# Patient Record
Sex: Female | Born: 1970 | Race: White | Hispanic: No | Marital: Married | Smoking: Never smoker
Health system: Southern US, Community
[De-identification: ages and names within clinical notes are randomized; demographics above are authoritative.]

## PROBLEM LIST (undated history)

## (undated) HISTORY — PX: DILATION AND CURETTAGE OF UTERUS: SHX78

## (undated) HISTORY — PX: TUBAL LIGATION: SHX77

---

## 2001-08-13 ENCOUNTER — Other Ambulatory Visit: Admission: RE | Admit: 2001-08-13 | Discharge: 2001-08-13 | Payer: Self-pay | Admitting: Obstetrics and Gynecology

## 2003-02-10 ENCOUNTER — Ambulatory Visit (HOSPITAL_COMMUNITY): Admission: RE | Admit: 2003-02-10 | Discharge: 2003-02-10 | Payer: Self-pay | Admitting: Obstetrics and Gynecology

## 2003-02-10 ENCOUNTER — Encounter (INDEPENDENT_AMBULATORY_CARE_PROVIDER_SITE_OTHER): Payer: Self-pay

## 2011-02-17 NOTE — H&P (Signed)
Kylie, [name]                         ACCOUNT NO.:  [id number]   MEDICAL RECORD NO.:  [id number]                   PATIENT TYPE:  AMB   LOCATION:  [facility]C                                  FACILITY:  [facility]   PHYSICIAN:  Dineen Kid. Rana Snare, M.D.                 DATE OF BIRTH:  [birth year]   DATE OF ADMISSION:  02/10/2003  DATE OF DISCHARGE:                                HISTORY & PHYSICAL   HISTORY OF PRESENT ILLNESS:  The patient is a 40 year old gravida 3, para 0,  abortus 3, that presents with menometrorrhagia and pelvic cramping. She has  a history of anovulation. She presented to the office on January 27, 2003,  having had heavy bleeding and not having had a cycle for several months  before this. She was placed on Provera.  She subsequently followed up with  more cramping, had sonohysterogram performed which showed a retroverted  uterus with several small uterine fibroids, one palpated in the lower  segment of the uterus.  On the sonohysterogram, the other submucosal on the  fundal area.  The ovaries appeared to be polycystic in nature on the  sonohysterogram, but several irregularities along the endometrial lining  consistent with polyps were also noted within the endometrial cavity as well  as fibroid.  The patient had been placed on Ov-Con 35 which dramatically  slowed the bleeding, however, over the last 24 to 48 hours, the patient  began having more cramping, albeit, not increasing bleeding and her  hemoglobin has been 13.7.  She presents for hysteroscopy and D&C for  evaluation and treatment of the endometrial lining.   PAST MEDICAL HISTORY:  Significant for infertility and polycystic ovarian  syndrome.  She has been through a fertility evaluation with normal  evaluation of the Prolactin and thyroid. She has also been placed on  Glucophage for ovulation stimulation.  At this point she is not trying to  get pregnant.   PAST GYN HISTORY:  She has had normal Pap smears.   PAST  OBSTETRICAL HISTORY:  She has had two early miscarriages, one at four  months and one at 2-1/2 months, and the third one she is not sure how far  along she was.   MEDICATIONS:  1. Ov-Con 35.  2. Ibuprofen.  3. Vicodin.   ALLERGIES:  No known drug allergies.   PHYSICAL EXAMINATION:  VITAL SIGNS:  Weight 284, blood pressure 124/80.  HEART:  Regular rate and rhythm.  LUNGS:  Clear to auscultation bilaterally.  ABDOMEN:  Obese, nontender.  PELVIC:  The uterus is anteverted, mobile, and nontender.  No adnexal masses  are palpable.   IMPRESSION:  Menometrorrhagia and dysmenorrhea.  Ultrasound consistent with  fibroids and endometrial polyps.  Recommend hysteroscopy and D&C for  evaluation and treatment of the polyps.  The patient understands that this  may or may not alleviate the cramping and may or may not  alleviate the  uterine bleeding. Because of the irregular surface and questionable polyps,  I recommend removal of these and evaluation by pathologist. If the fibroids  are significant, then we would not resect at the time, but otherwise would  consider myomectomy or even hysterectomy in the future.  The patient wishes  to proceed with plan to remove polyps, but not fibroids at this time.  Risks  and benefits were discussed at length which include, but are not limited to  risk of infection, bleeding, damage to uterus, tubes, ovaries, bowel, and  bladder, again the possibility that this may not alleviate the pain or the  bleeding and may require further surgery in the future, and risks associated  with blood transfusion. She does give her informed consent.                                               Dineen Kid Rana Snare, M.D.    DCL/MEDQ  D:  02/10/2003  T:  02/10/2003  Job:  [id number]

## 2011-02-17 NOTE — Op Note (Signed)
[name], [name]                         ACCOUNT NO.:  [id number]   MEDICAL RECORD NO.:  [id number]                   PATIENT TYPE:  AMB   LOCATION:  [facility]C                                  FACILITY:  [facility]   PHYSICIAN:  Dineen Kid. Rana Snare, M.D.                 DATE OF BIRTH:  1970/10/27   DATE OF PROCEDURE:  02/10/2003  DATE OF DISCHARGE:                                 OPERATIVE REPORT   PREOPERATIVE DIAGNOSES:  1. Menometrorrhagia.  2. Dysmenorrhea.   POSTOPERATIVE DIAGNOSES:  1. Menorrhagia.  2. Dysmenorrhea.   PROCEDURES:  Hysteroscopy, dilatation and curettage.   SURGEON:  Dineen Kid. Rana Snare, M.D.   ANESTHESIA:  General by mask.   INDICATIONS:  The patient is a 40 year old G3, P0, with worsening  menometrorrhagia.  Sonohysterogram shows thickening and irregular  endometrium with intramural fibroid and questionable polyps.  She has  continued to have heavy and irregular bleeding and presents for definitive  surgical evaluation and treatment, planned hysteroscopy D&C.  The risks and  benefits were discussed at length.  Informed consent was obtained.  See  history and physical for further details.   FINDINGS:  Findings at the time of surgery were thickened and irregular  endometrial surface with polypoid-appearing tissue.  After the D&C, a normal-  appearing cervix and endometrium were noted and normal-appearing ostia.   DESCRIPTION OF PROCEDURE:  After adequate analgesia, the patient was placed  in the dorsal lithotomy position.  She was sterilely prepped and draped, the  bladder was sterilely drained.  A weighted speculum was placed.  A tenaculum  was placed on the anterior lip of the cervix.  The uterus is sounded to 10  cm, easily dilated serially to a #29 Pratt dilator.  The hysteroscope was  inserted and the above findings were noted.  Curettage was performed,  retrieving a mild to moderate amount of endometrial curettings.  This was  performed until a gritty surface was  felt throughout the endometrial cavity,  followed by the hysteroscope revealing a normal-appearing endometrial cavity  at this point with no residual polypoid tissue, normal-appearing ostia,  normal-appearing cervix.  At this point the hysteroscope was removed, the  tenaculum was removed from the cervix, noted to be hemostatic.  The speculum  was then removed.  The patient was transferred to the recovery room in  stable condition.  Sponge and instrument count were normal x2.  Estimated  blood loss was less than 20 mL, and the sorbitol deficit was 50 mL.    DISPOSITION:  The patient is discharged home, to follow up in the office in  two to three weeks, with a routine instruction sheet for D&C.  She is going  to continue with her Ovcon 35 one a day, and she was given a prescription  for Tylox one to two every four hours as needed for pain.  Told to return  for increased  pain, fever, or bleeding.                                               Dineen Kid Rana Snare, M.D.    DCL/MEDQ  D:  02/10/2003  T:  02/11/2003  Job:  [id number]

## 2018-01-05 ENCOUNTER — Emergency Department (HOSPITAL_BASED_OUTPATIENT_CLINIC_OR_DEPARTMENT_OTHER): Payer: Self-pay

## 2018-01-05 ENCOUNTER — Encounter (HOSPITAL_BASED_OUTPATIENT_CLINIC_OR_DEPARTMENT_OTHER): Payer: Self-pay | Admitting: *Deleted

## 2018-01-05 ENCOUNTER — Other Ambulatory Visit: Payer: Self-pay

## 2018-01-05 ENCOUNTER — Emergency Department (HOSPITAL_BASED_OUTPATIENT_CLINIC_OR_DEPARTMENT_OTHER)
Admission: EM | Admit: 2018-01-05 | Discharge: 2018-01-06 | Disposition: A | Discharge status: Home / Self Care | Payer: Self-pay | Attending: Emergency Medicine | Admitting: Emergency Medicine

## 2018-01-05 DIAGNOSIS — J189 Pneumonia, unspecified organism: Secondary | ICD-10-CM

## 2018-01-05 DIAGNOSIS — R1032 Left lower quadrant pain: Secondary | ICD-10-CM | POA: Insufficient documentation

## 2018-01-05 DIAGNOSIS — Z79899 Other long term (current) drug therapy: Secondary | ICD-10-CM | POA: Insufficient documentation

## 2018-01-05 DIAGNOSIS — R109 Unspecified abdominal pain: Secondary | ICD-10-CM

## 2018-01-05 DIAGNOSIS — J181 Lobar pneumonia, unspecified organism: Secondary | ICD-10-CM

## 2018-01-05 LAB — URINALYSIS, ROUTINE W REFLEX MICROSCOPIC
BILIRUBIN URINE: NEGATIVE
Glucose, UA: NEGATIVE mg/dL
KETONES UR: NEGATIVE mg/dL
Leukocytes, UA: NEGATIVE
NITRITE: NEGATIVE
Protein, ur: NEGATIVE mg/dL
Specific Gravity, Urine: 1.01 (ref 1.005–1.030)
pH: 7 (ref 5.0–8.0)

## 2018-01-05 LAB — PREGNANCY, URINE: PREG TEST UR: NEGATIVE

## 2018-01-05 LAB — URINALYSIS, MICROSCOPIC (REFLEX)

## 2018-01-05 MED ORDER — SODIUM CHLORIDE 0.9 % IV BOLUS
1000.0000 mL | Freq: Once | INTRAVENOUS | Status: AC
  Administered 2018-01-05: 1000 mL via INTRAVENOUS

## 2018-01-05 MED ORDER — HYDROMORPHONE HCL 1 MG/ML IJ SOLN
INTRAMUSCULAR | Status: AC
  Administered 2018-01-05: 1 mg
  Filled 2018-01-05: qty 1

## 2018-01-05 MED ORDER — PROMETHAZINE HCL 25 MG/ML IJ SOLN
INTRAMUSCULAR | Status: AC
  Administered 2018-01-05: 12.5 mg
  Filled 2018-01-05: qty 1

## 2018-01-05 NOTE — ED Notes (Signed)
Patient violently vomiting and writhing in pain; EDP aware; patient states she is exclusively breast feeding EDP aware, orders received for dilaudid and phenergan for pain and nausea vomiting. Per Dr Lowella BandyPheiffer.

## 2018-01-05 NOTE — ED Triage Notes (Signed)
Pt reports left flank pain since 3 pm with chills and nausea. Pt has hx of kidney stones

## 2018-01-06 ENCOUNTER — Emergency Department (HOSPITAL_BASED_OUTPATIENT_CLINIC_OR_DEPARTMENT_OTHER): Payer: Self-pay

## 2018-01-06 LAB — CBC WITH DIFFERENTIAL/PLATELET
BASOS ABS: 0 10*3/uL (ref 0.0–0.1)
Basophils Relative: 0 %
EOS PCT: 0 %
Eosinophils Absolute: 0 10*3/uL (ref 0.0–0.7)
HEMATOCRIT: 41.2 % (ref 36.0–46.0)
Hemoglobin: 15.1 g/dL — ABNORMAL HIGH (ref 12.0–15.0)
LYMPHS ABS: 1.7 10*3/uL (ref 0.7–4.0)
LYMPHS PCT: 8 %
MCH: 32.2 pg (ref 26.0–34.0)
MCHC: 36.7 g/dL — ABNORMAL HIGH (ref 30.0–36.0)
MCV: 87.8 fL (ref 78.0–100.0)
MONOS PCT: 7 %
Monocytes Absolute: 1.5 10*3/uL — ABNORMAL HIGH (ref 0.1–1.0)
NEUTROS ABS: 17.6 10*3/uL — AB (ref 1.7–7.7)
Neutrophils Relative %: 85 %
Platelets: 258 10*3/uL (ref 150–400)
RBC: 4.69 MIL/uL (ref 3.87–5.11)
RDW: 13.5 % (ref 11.5–15.5)
WBC: 20.9 10*3/uL — ABNORMAL HIGH (ref 4.0–10.5)

## 2018-01-06 LAB — COMPREHENSIVE METABOLIC PANEL
ALBUMIN: 3.7 g/dL (ref 3.5–5.0)
ALT: 50 U/L (ref 14–54)
AST: 42 U/L — ABNORMAL HIGH (ref 15–41)
Alkaline Phosphatase: 92 U/L (ref 38–126)
Anion gap: 13 (ref 5–15)
BUN: 10 mg/dL (ref 6–20)
CHLORIDE: 103 mmol/L (ref 101–111)
CO2: 22 mmol/L (ref 22–32)
CREATININE: 0.69 mg/dL (ref 0.44–1.00)
Calcium: 8.1 mg/dL — ABNORMAL LOW (ref 8.9–10.3)
GFR calc Af Amer: >60 mL/min (ref >60)
GLUCOSE: 170 mg/dL — AB (ref 65–99)
POTASSIUM: 3 mmol/L — AB (ref 3.5–5.1)
Sodium: 138 mmol/L (ref 135–145)
Total Bilirubin: 1 mg/dL (ref 0.3–1.2)
Total Protein: 6.8 g/dL (ref 6.5–8.1)

## 2018-01-06 LAB — TROPONIN I: Troponin I: <0.03 ng/mL (ref <0.03)

## 2018-01-06 LAB — LIPASE, BLOOD: Lipase: 23 U/L (ref 11–51)

## 2018-01-06 MED ORDER — AZITHROMYCIN 250 MG PO TABS: 250.0000 mg | ORAL_TABLET | Freq: Every day | ORAL | 0 refills | Status: AC

## 2018-01-06 MED ORDER — LACTATED RINGERS IV BOLUS
1000.0000 mL | Freq: Once | INTRAVENOUS | Status: AC
  Administered 2018-01-06: 1000 mL via INTRAVENOUS

## 2018-01-06 MED ORDER — CEPHALEXIN 500 MG PO CAPS: 500.0000 mg | ORAL_CAPSULE | Freq: Four times a day (QID) | ORAL | 0 refills | Status: AC

## 2018-01-06 MED ORDER — SODIUM CHLORIDE 0.9 % IV SOLN
500.0000 mg | Freq: Once | INTRAVENOUS | Status: AC
  Administered 2018-01-06: 500 mg via INTRAVENOUS
  Filled 2018-01-06: qty 500

## 2018-01-06 MED ORDER — AZITHROMYCIN 500 MG IV SOLR
INTRAVENOUS | Status: AC
  Filled 2018-01-06: qty 500

## 2018-01-06 MED ORDER — OXYCODONE-ACETAMINOPHEN 5-325 MG PO TABS: 1.0000 | ORAL_TABLET | Freq: Three times a day (TID) | ORAL | 0 refills | Status: AC | PRN

## 2018-01-06 MED ORDER — IOPAMIDOL (ISOVUE-370) INJECTION 76%
100.0000 mL | Freq: Once | INTRAVENOUS | Status: AC | PRN
  Administered 2018-01-06: 100 mL via INTRAVENOUS

## 2018-01-06 MED ORDER — HYDROMORPHONE HCL 1 MG/ML IJ SOLN
1.0000 mg | Freq: Once | INTRAMUSCULAR | Status: AC
  Administered 2018-01-06: 1 mg via INTRAVENOUS
  Filled 2018-01-06: qty 1

## 2018-01-06 MED ORDER — ONDANSETRON HCL 4 MG/2ML IJ SOLN
4.0000 mg | Freq: Once | INTRAMUSCULAR | Status: AC
  Administered 2018-01-06: 4 mg via INTRAVENOUS
  Filled 2018-01-06: qty 2

## 2018-01-06 MED ORDER — SODIUM CHLORIDE 0.9 % IV SOLN
1.0000 g | Freq: Once | INTRAVENOUS | Status: AC
  Administered 2018-01-06: 1 g via INTRAVENOUS
  Filled 2018-01-06: qty 10

## 2018-01-06 MED ORDER — ACETAMINOPHEN 500 MG PO TABS
1000.0000 mg | ORAL_TABLET | Freq: Once | ORAL | Status: AC
  Administered 2018-01-06: 1000 mg via ORAL
  Filled 2018-01-06: qty 2

## 2018-01-06 NOTE — ED Notes (Signed)
Assisted patient to bathroom and in the hallway; pulse ox monitored during ambulation noted to maintain between 95-96%.

## 2018-01-06 NOTE — Discharge Instructions (Signed)
Try to avoid percocet use as much as possible and utilize plain tylenol. If your pain is not controlled and you have to use percocet, please pump and dump approximately 4 hours after dose of percocet.  If you notice your child having shallow breathing, not breathing, increased lethargy then please stop medication immediately and have them evaluated.

## 2018-01-06 NOTE — ED Notes (Signed)
Pt returned from CT °

## 2018-01-06 NOTE — ED Provider Notes (Signed)
[facility] EMERGENCY DEPARTMENT Provider Note   CSN: [id number] Arrival date & time: 01/05/18  1957     History   Chief Complaint Chief Complaint  Patient presents with  . Flank Pain    HPI Mahima Myriam ForehandJ Lobato is a 47 y.o. female.  HPI Patient had acute onset of left flank pain today.  She reports it started quite suddenly and was very sharp and severe.  Just briefly before it started she felt a little nauseated with some chills.  He has not had any cough or sputum production.  No blood in the urine or pain or burning with urination.  Patient does have prior history of kidney stone.  She is not had any recent vomiting or diarrheal illness. No  Lower extremity swelling or calf pain.  No history of PE or DVT.  No recent immobilization or surgery.  Patient is 10 months postpartum and breast feeds. History reviewed. No pertinent past medical history.  There are no active problems to display for this patient.   Past Surgical History:  Procedure Laterality Date  . CESAREAN SECTION    . DILATION AND CURETTAGE OF UTERUS    . TUBAL LIGATION       OB History   None      Home Medications    Prior to Admission medications   Medication Sig Start Date End Date Taking? Authorizing Provider  sertraline (ZOLOFT) 25 MG tablet Take 25 mg by mouth daily.   Yes [provider]    Family History No family history on file.  Social History Social History   Tobacco Use  . Smoking status: Never Smoker  . Smokeless tobacco: Never Used  Substance Use Topics  . Alcohol use: Never    Frequency: Never  . Drug use: Never     Allergies   Patient has no known allergies.   Review of Systems Review of Systems 10 Systems reviewed and are negative for acute change except as noted in the HPI.   Physical Exam Updated Vital Signs BP 127/72 (BP Location: Right Arm)   Pulse (!) 112   Temp 99.9 F (37.7 C) (Oral)   Resp 16   Ht 5\' 4"  (1.626 m)   Wt 117.9 kg (260  lb)   SpO2 93%   BMI 44.63 kg/m   Physical Exam  Constitutional: She is oriented to person, place, and time.  Patient appears very uncomfortable.  She is alert and appropriate.  No respiratory distress.  Tachypnea.  HENT:  Head: Normocephalic and atraumatic.  Mouth/Throat: Oropharynx is clear and moist.  Eyes: EOM are normal.  Neck: Neck supple.  Cardiovascular: Normal rate, regular rhythm, normal heart sounds and intact distal pulses.  Pulmonary/Chest: Effort normal and breath sounds normal.  Abdominal: Soft.  No significant abdominal tenderness to palpation.  No guarding no rebound.  No CVA tenderness.  Musculoskeletal: Normal range of motion. She exhibits no edema or tenderness.  Neurological: She is alert and oriented to person, place, and time. No cranial nerve deficit. She exhibits normal muscle tone. Coordination normal.  Skin: Skin is warm and dry.  Psychiatric: She has a normal mood and affect.     ED Treatments / Results  Labs (all labs ordered are listed, but only abnormal results are displayed) Labs Reviewed  URINALYSIS, ROUTINE W REFLEX MICROSCOPIC - Abnormal; Notable for the following components:      Result Value   Color, Urine STRAW (*)    Hgb urine dipstick TRACE (*)  All other components within normal limits  URINALYSIS, MICROSCOPIC (REFLEX) - Abnormal; Notable for the following components:   Bacteria, UA FEW (*)    Squamous Epithelial / LPF 0-5 (*)    All other components within normal limits  PREGNANCY, URINE  COMPREHENSIVE METABOLIC PANEL  LIPASE, BLOOD  CBC WITH DIFFERENTIAL/PLATELET  TROPONIN I    EKG EKG Interpretation  Date/Time:  Sunday January 06 2018 00:31:57 EDT Ventricular Rate:  109 PR Interval:    QRS Duration: 101 QT Interval:  371 QTC Calculation: 500 R Axis:   121 Text Interpretation:  Sinus tachycardia Confirmed by Arby Barrette [phone]) on 01/06/2018 12:43:32 AM   Radiology Ct Renal Stone Study  Result Date:  01/06/2018 CLINICAL DATA:  47 y/o F; 9 hours of left-sided flank pain and trace hematuria. EXAM: CT ABDOMEN AND PELVIS WITHOUT CONTRAST TECHNIQUE: Multidetector CT imaging of the abdomen and pelvis was performed following the standard protocol without IV contrast. COMPARISON:  None. FINDINGS: Lower chest: Small left lower lobe consolidation in the posterior costal diaphragmatic angle (series 4, image 81) with small internal foci of lucency in suggestion of a halo. Hepatobiliary: Hepatic steatosis. No focal liver abnormality is seen. Cholelithiasis. No findings of acute cholecystitis. No biliary ductal dilatation. Pancreas: Unremarkable. No pancreatic ductal dilatation or surrounding inflammatory changes. Spleen: Normal in size without focal abnormality. Round periphery calcified structure within the renal hilum measuring up to 17 mm may represent a splenic artery aneurysm (series 2, image 36). Adrenals/Urinary Tract: Adrenal glands are unremarkable. Subcentimeter right kidney lower pole exophytic cyst. Otherwise kidneys are normal, without renal calculi, focal lesion, or hydronephrosis. Bladder is unremarkable. Stomach/Bowel: Stomach is within normal limits. Appendix appears normal. No evidence of bowel wall thickening, distention, or inflammatory changes. Vascular/Lymphatic: No significant vascular findings are present. No enlarged abdominal or pelvic lymph nodes. Reproductive: Uterus and bilateral adnexa are unremarkable. Other: No abdominal wall hernia or abnormality. No abdominopelvic ascites. Musculoskeletal: No acute or significant osseous findings. IMPRESSION: 1. No hydronephrosis or ureter stone identified. 2. Small left posterior lower lobe consolidation may represent a small area of pneumonia, but there are features of pulmonary infarct. Correlation with D-dimer recommended. 3. Cholelithiasis.  No secondary signs of acute cholecystitis. 4. 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 Electronically Signed   By: Mitzi Hansen M.D.   On: 01/06/2018 00:11    Procedures Procedures (including critical care time)  Medications Ordered in ED Medications  promethazine (PHENERGAN) 25 MG/ML injection (12.5 mg  Given 01/05/18 2131)  HYDROmorphone (DILAUDID) 1 MG/ML injection (1 mg  Given 01/05/18 2134)  sodium chloride 0.9 % bolus 1,000 mL (0 mLs Intravenous Stopped 01/05/18 2200)     Initial Impression / Assessment and Plan / ED Course  I have reviewed the triage vital signs and the nursing notes.  Pertinent labs & imaging results that were available during my care of the patient were reviewed by me and considered in my medical decision making (see chart for details).      Final Clinical Impressions(s) / ED Diagnoses   Final diagnoses:  Left flank pain   Patient presented with acute and severe onset of left flank pain and known history of kidney stone.  Initial suspicion was for kidney stone.  She had responded well to pain control with Dilaudid and antiemetic.  CTA however does not show stone.  Base of the lung does identify a possible infiltrate or infarct.  At this time  will need to proceed with CT PE study and lab work.  Dr. Clayborne Dana will follow up on the results of these diagnostic tests and reevaluate the patient for final disposition.  Patient has remained stable with stable oxygenation, blood pressures and heart rate.  Pain is managed with Dilaudid. ED Discharge Orders    None       Arby Barrette, MD 01/10/18 1247

## 2018-01-06 NOTE — ED Provider Notes (Signed)
5:29 AM Assumed care from Dr. Donnald GarrePfeiffer, please see their note for full history, physical and decision making until this point. In brief this is a 47 y.o. year old female who presented to the ED tonight with Flank Pain     Patient with concern for kidney stone 2/2 flank pain on left with chills/nausea but CT scan showed lll infiltrate vs infarct. Pending CT PE.   CT without perforated bolus timing however does show likely consolidation in left lower lobe.  Will start antibiotics.  On reexamination patient sats improved to 98% when she is awake.  She feels very warm so I suspect she likely has a fever as a cause for her heart rate so we will give her a dose of Tylenol.  Patient is tolerating p.o.  After antibiotics we will have her ambulate and likely discharge as she appears well without distress.   Ambulated on pulse ox with normal oxygen. No distress. Will dc on abx/pain meds. Will pump breast milk after narcotics and dispose of it properly but will try to avoid narcotics if possible. Return precautions provided. Stable for discharge at this time.   Discharge instructions, including strict return precautions for new or worsening symptoms, given. Patient and/or family verbalized understanding and agreement with the plan as described.   Labs, studies and imaging reviewed by myself and considered in medical decision making if ordered. Imaging interpreted by radiology.  Labs Reviewed  URINALYSIS, ROUTINE W REFLEX MICROSCOPIC - Abnormal; Notable for the following components:      Result Value   Color, Urine STRAW (*)    Hgb urine dipstick TRACE (*)    All other components within normal limits  URINALYSIS, MICROSCOPIC (REFLEX) - Abnormal; Notable for the following components:   Bacteria, UA FEW (*)    Squamous Epithelial / LPF 0-5 (*)    All other components within normal limits  COMPREHENSIVE METABOLIC PANEL - Abnormal; Notable for the following components:   Potassium 3.0 (*)    Glucose, Bld  170 (*)    Calcium 8.1 (*)    AST 42 (*)    All other components within normal limits  CBC WITH DIFFERENTIAL/PLATELET - Abnormal; Notable for the following components:   WBC 20.9 (*)    Hemoglobin 15.1 (*)    MCHC 36.7 (*)    Neutro Abs 17.6 (*)    Monocytes Absolute 1.5 (*)    All other components within normal limits  PREGNANCY, URINE  LIPASE, BLOOD  TROPONIN I    CT Angio Chest PE W/Cm &/Or Wo Cm  Final Result    CT Renal Stone Study  Final Result      No follow-ups on file.    Larry Knipp, Barbara CowerJason, MD 01/06/18 [phone]

## 2019-07-17 IMAGING — CT CT RENAL STONE PROTOCOL
2 of 4 series · 16 of 46 positions shown, 18 images · non-contrast
Comparison: None.

CLINICAL DATA: 46 y/o F; 9 hours of left-sided flank pain and trace
hematuria.

EXAM:
CT ABDOMEN AND PELVIS WITHOUT CONTRAST
TECHNIQUE: Multidetector CT imaging of the abdomen and pelvis was performed
following the standard protocol without IV contrast.

[Series 2: axial st · axial · 0.83mm/px · z∈[-523,-43]mm · 13 of 106 slices shown, 15 images]
[im 5/106  soft-tissue]
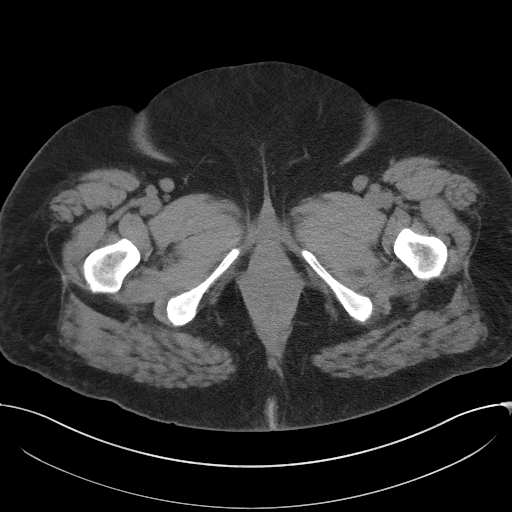
[im 5/106  bone]
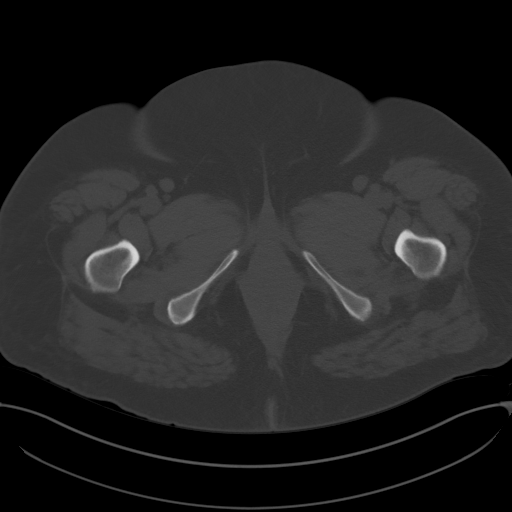
[im 15/106  soft-tissue]
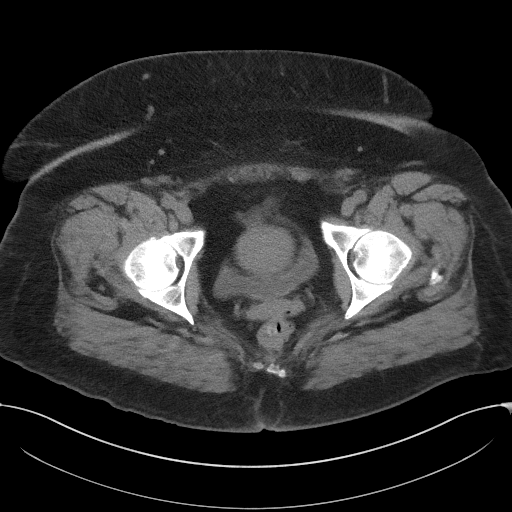
[im 24/106  soft-tissue]
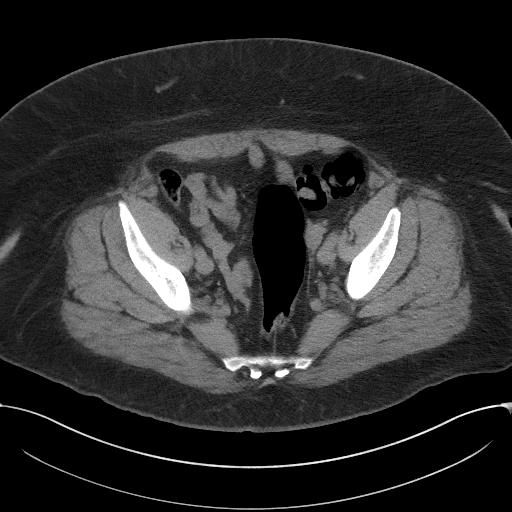
[im 29/106  soft-tissue]
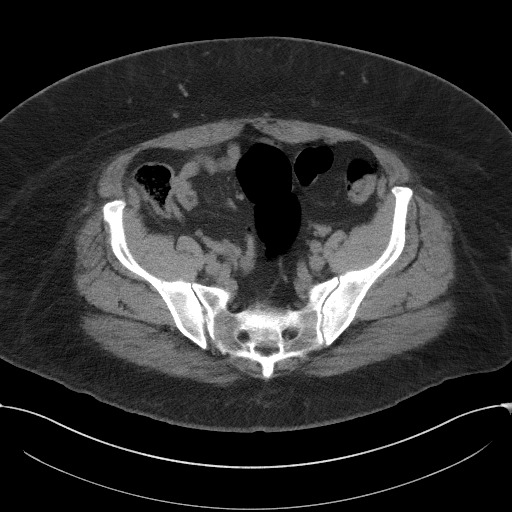
[im 39/106  soft-tissue]
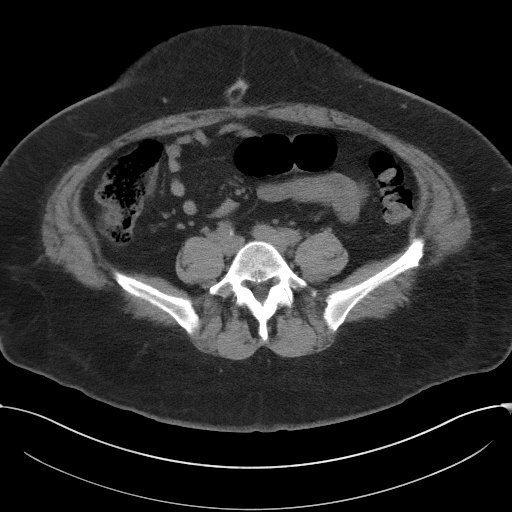
[im 43/106  soft-tissue]
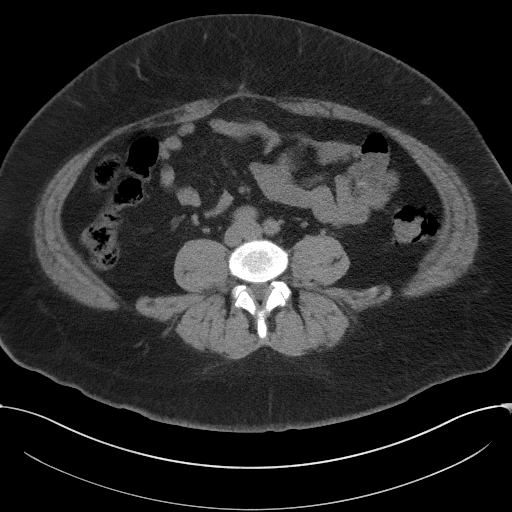
[im 53/106  soft-tissue]
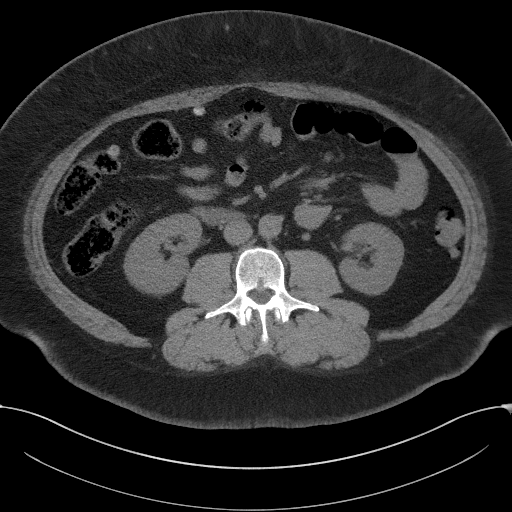
[im 63/106  soft-tissue]
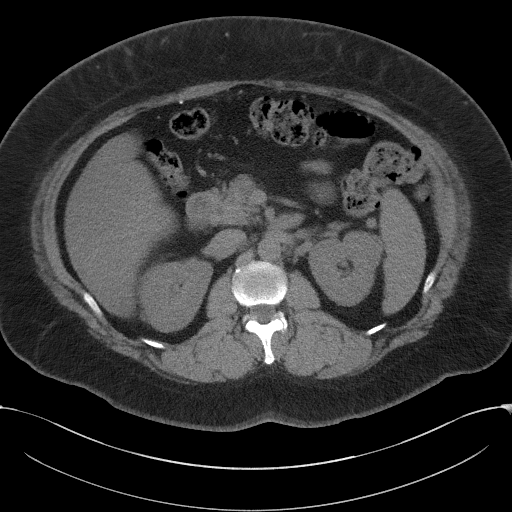
[im 67/106  soft-tissue]
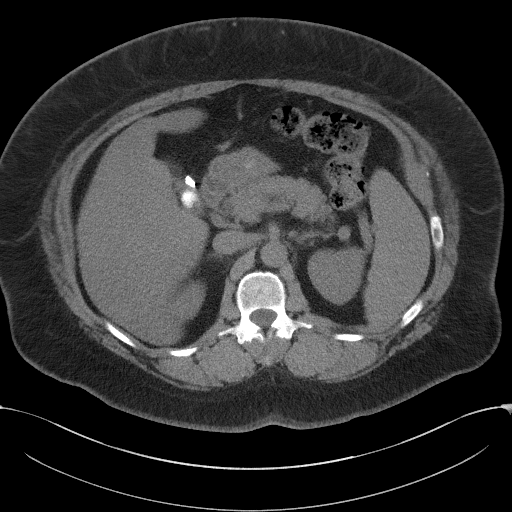
[im 67/106  bone]
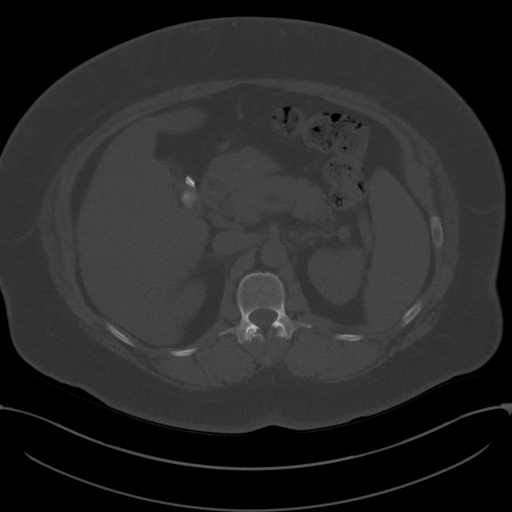
[im 77/106  soft-tissue]
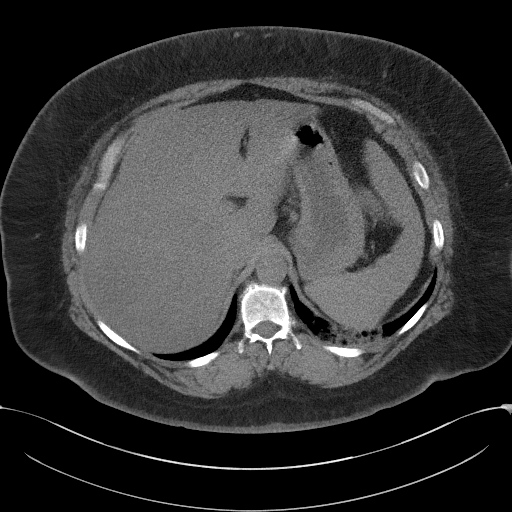
[im 82/106  soft-tissue]
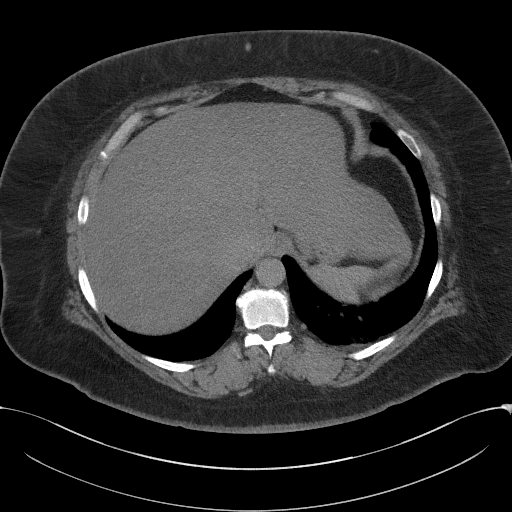
[im 91/106  soft-tissue]
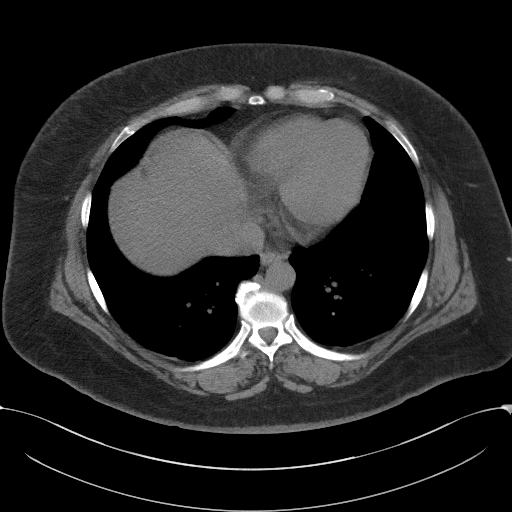
[im 101/106  soft-tissue]
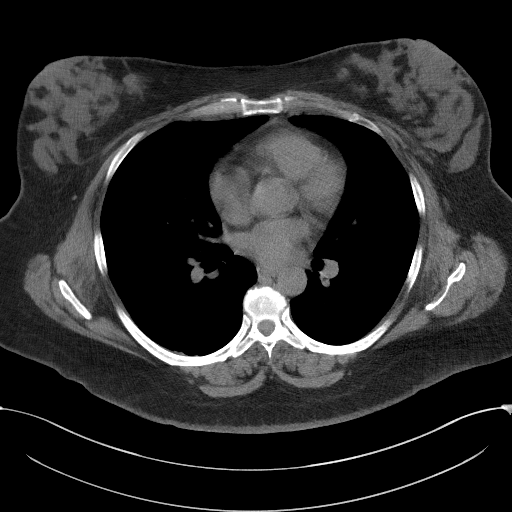

[Series 4: coronal st · coronal · 1.04mm/px · 3 of 103 slices shown]
[im 35/103  soft-tissue]
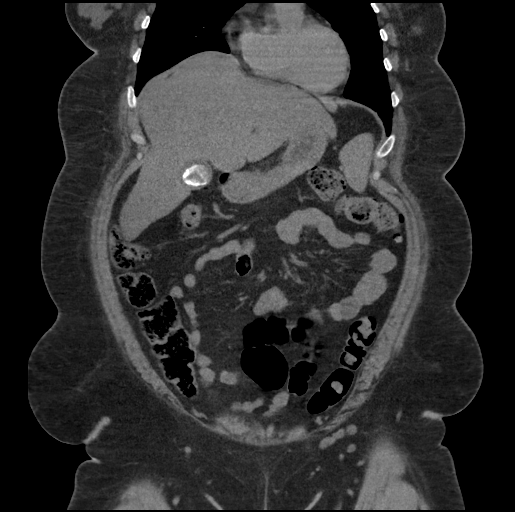
[im 46/103  soft-tissue]
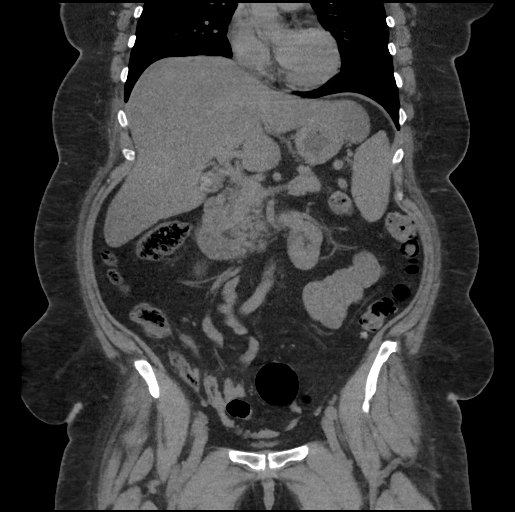
[im 57/103  soft-tissue]
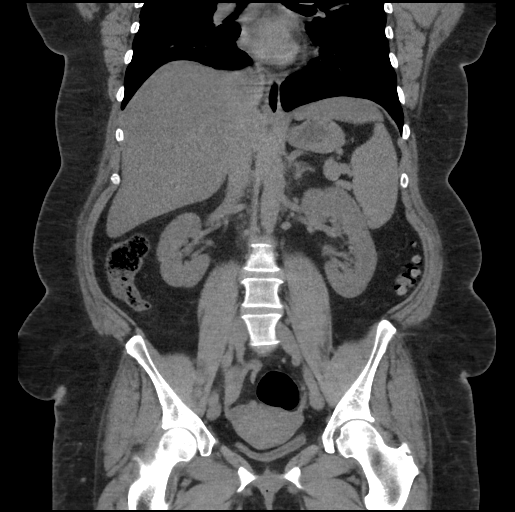

[16 of 46 positions shown; findings below may reference images not displayed]

FINDINGS: Lower chest: Small left lower lobe consolidation in the posterior
costal diaphragmatic angle (series 4, image 81) with small internal
foci of lucency in suggestion of a halo.

Hepatobiliary: Hepatic steatosis. No focal liver abnormality is
seen. Cholelithiasis. No findings of acute cholecystitis. No biliary
ductal dilatation.

Pancreas: Unremarkable. No pancreatic ductal dilatation or
surrounding inflammatory changes.

Spleen: Normal in size without focal abnormality. Round periphery
calcified structure within the renal hilum measuring up to 17 mm may
represent a splenic artery aneurysm (series 2, image 36).

Adrenals/Urinary Tract: Adrenal glands are unremarkable.
Subcentimeter right kidney lower pole exophytic cyst. Otherwise
kidneys are normal, without renal calculi, focal lesion, or
hydronephrosis. Bladder is unremarkable.

Stomach/Bowel: Stomach is within normal limits. Appendix appears
normal. No evidence of bowel wall thickening, distention, or
inflammatory changes.

Vascular/Lymphatic: No significant vascular findings are present. No
enlarged abdominal or pelvic lymph nodes.

Reproductive: Uterus and bilateral adnexa are unremarkable.

Other: No abdominal wall hernia or abnormality. No abdominopelvic
ascites.

Musculoskeletal: No acute or significant osseous findings.
IMPRESSION: 1. No hydronephrosis or ureter stone identified.
2. Small left posterior lower lobe consolidation may represent a
small area of pneumonia, but there are features of pulmonary
infarct. Correlation with D-dimer recommended.
3. Cholelithiasis.  No secondary signs of acute cholecystitis.
4. Round calcified structure in left renal hilum measuring up to 17
mm, possible splenic artery aneurysm.

By: Duda Gillespie M.D.
